# Patient Record
Sex: Male | Born: 1975 | Race: White | Hispanic: No | Smoking: Current some day smoker
Health system: Southern US, Community
[De-identification: ages and names within clinical notes are randomized; demographics above are authoritative.]

---

## 2000-08-26 ENCOUNTER — Emergency Department (HOSPITAL_COMMUNITY): Admission: EM | Admit: 2000-08-26 | Discharge: 2000-08-26 | Payer: Self-pay

## 2004-10-23 ENCOUNTER — Emergency Department (HOSPITAL_COMMUNITY): Admission: EM | Admit: 2004-10-23 | Discharge: 2004-10-23 | Payer: Self-pay | Admitting: Emergency Medicine

## 2005-09-18 ENCOUNTER — Emergency Department (HOSPITAL_COMMUNITY): Admission: EM | Admit: 2005-09-18 | Discharge: 2005-09-18 | Payer: Self-pay | Admitting: Family Medicine

## 2005-10-01 IMAGING — CT CT HEAD W/O CM
1 of 2 series · 13 of 30 positions shown, 17 images · IV contrast (agent unspecified)
Comparison: none

CLINICAL DATA: Altered level of consciousness.  
 CT HEAD WITHOUT CONTRAST:
TECHNIQUE: Multidetector helical CT scanning obtained from the skull base to the vertex.

[Series 3: — · axial · 0.43mm/px · z∈[-170,-45]mm · 13 of 31 slices shown, 17 images]
[im 3/31  brain]
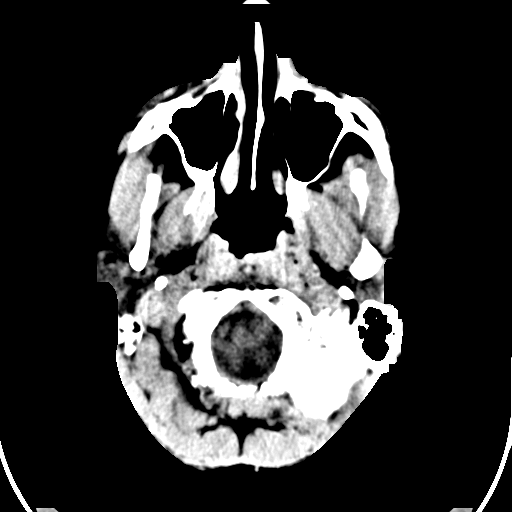
[im 3/31  bone]
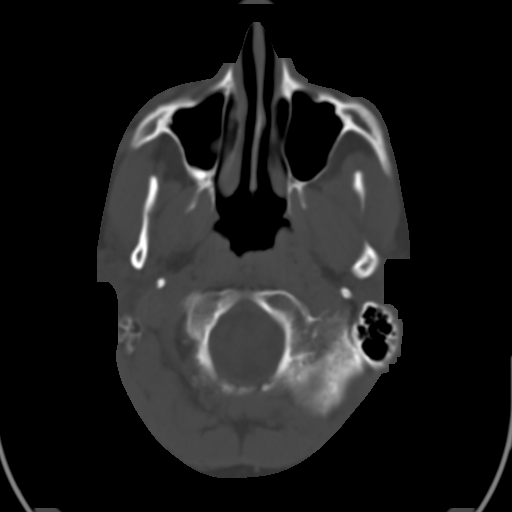
[im 5/31  brain]
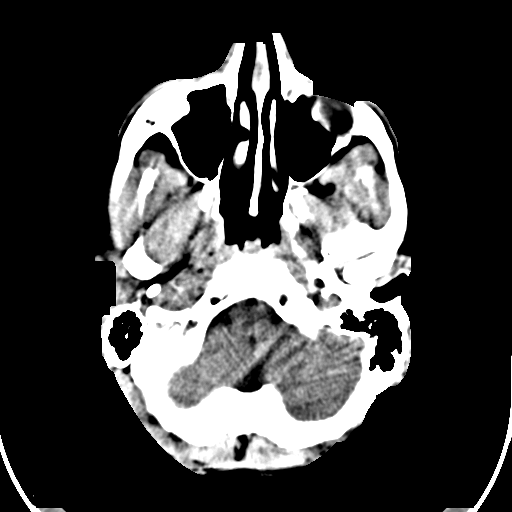
[im 7/31  brain]
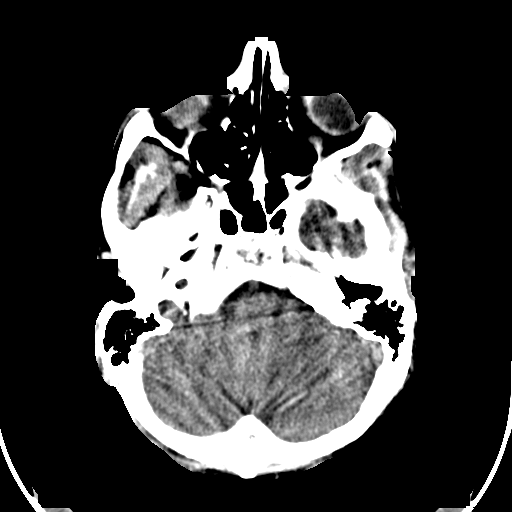
[im 9/31  brain]
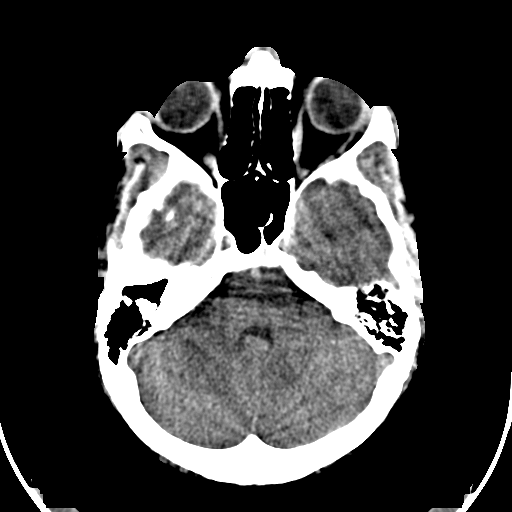
[im 11/31  brain]
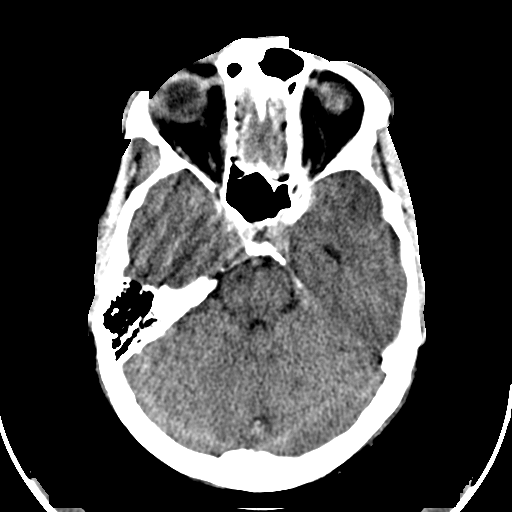
[im 11/31  bone]
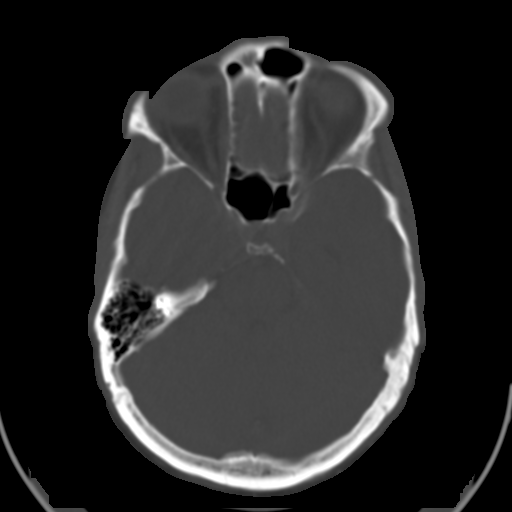
[im 13/31  brain]
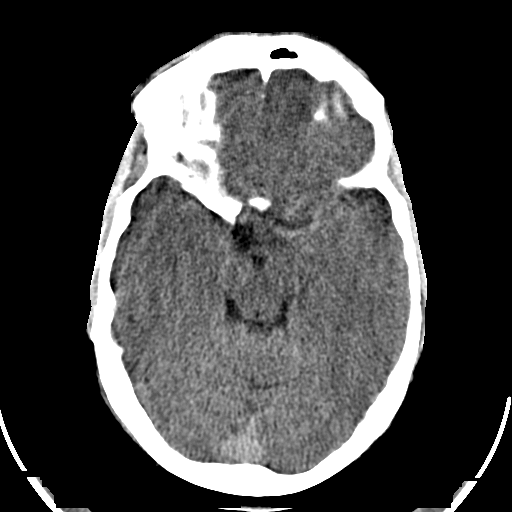
[im 16/31  brain]
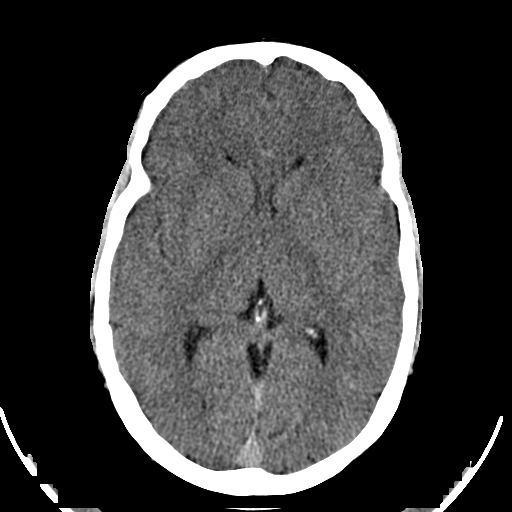
[im 18/31  brain]
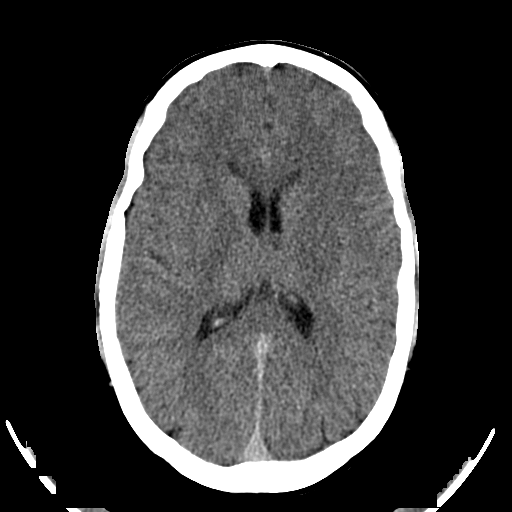
[im 20/31  brain]
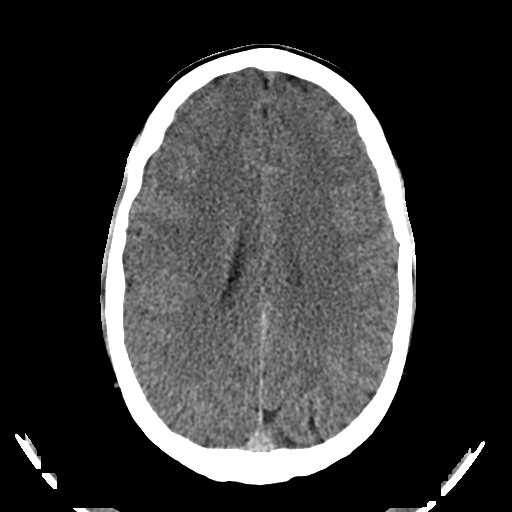
[im 20/31  bone]
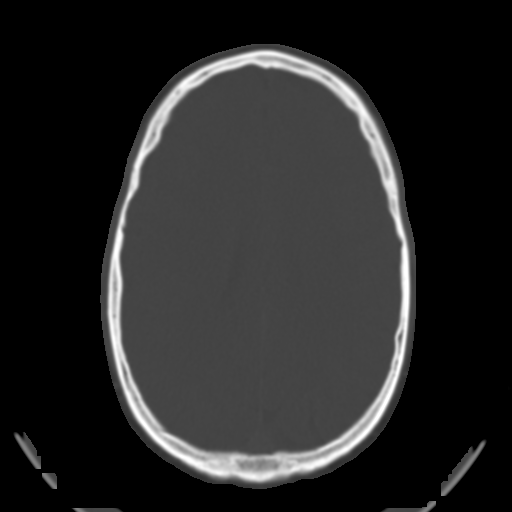
[im 22/31  brain]
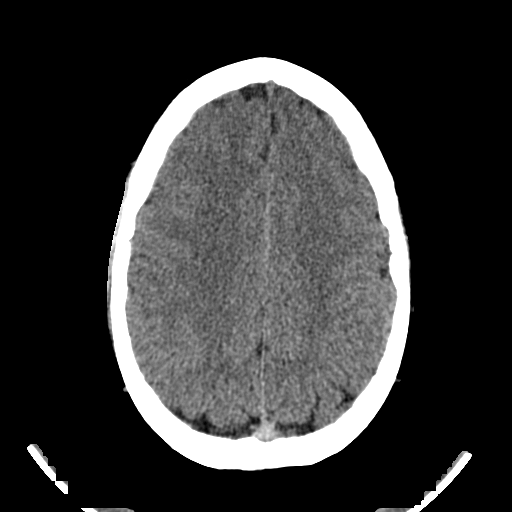
[im 24/31  brain]
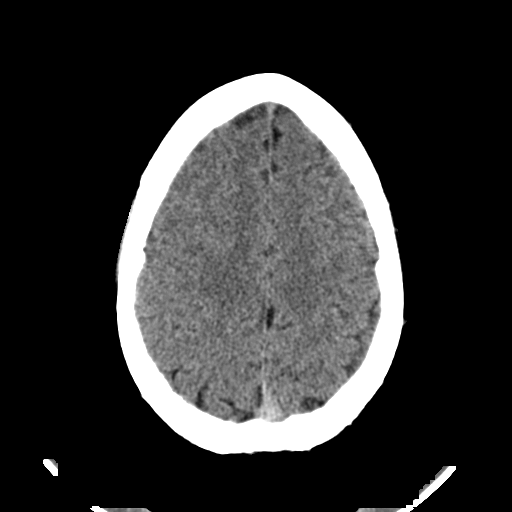
[im 26/31  brain]
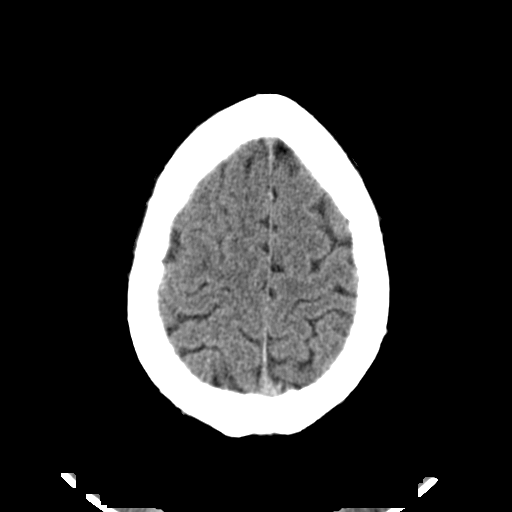
[im 28/31  brain]
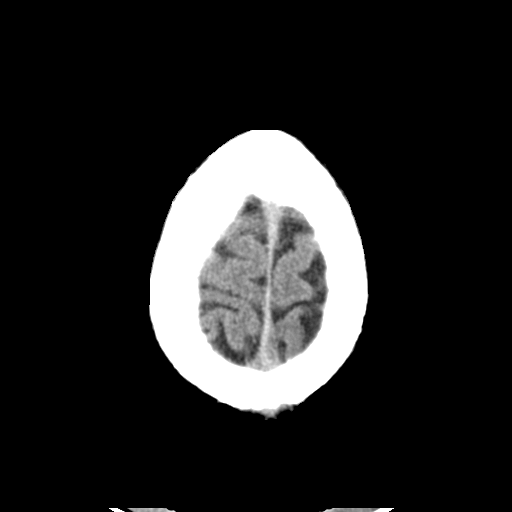
[im 28/31  bone]
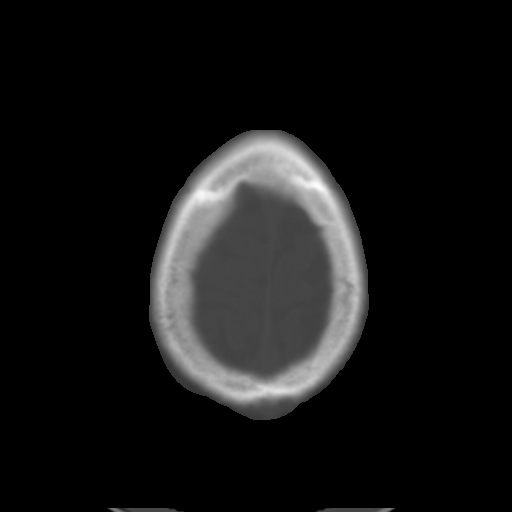

[13 of 30 positions shown; findings below may reference images not displayed]

FINDINGS: No comparison films available. 
 No evidence of acute intracranial abnormality including mass or mass effect, hydrocephalus, extraaxial fluid collection, midline shift, hemorrhage, or infarct.  Acute infarct may be missed by CT for 24 to 48 hours.  The visualized bony calvarium and paranasal sinuses are unremarkable.
IMPRESSION: No evidence of acute intracranial abnormality.

## 2009-01-15 ENCOUNTER — Emergency Department (HOSPITAL_COMMUNITY): Admission: EM | Admit: 2009-01-15 | Discharge: 2009-01-15 | Payer: Self-pay | Admitting: Emergency Medicine

## 2011-07-09 ENCOUNTER — Emergency Department (HOSPITAL_COMMUNITY)
Admission: EM | Admit: 2011-07-09 | Discharge: 2011-07-09 | Disposition: A | Discharge status: Home / Self Care | Payer: Self-pay | Attending: Emergency Medicine | Admitting: Emergency Medicine

## 2011-07-09 DIAGNOSIS — X58XXXA Exposure to other specified factors, initial encounter: Secondary | ICD-10-CM | POA: Insufficient documentation

## 2011-07-09 DIAGNOSIS — S025XXA Fracture of tooth (traumatic), initial encounter for closed fracture: Secondary | ICD-10-CM | POA: Insufficient documentation

## 2011-07-09 DIAGNOSIS — K089 Disorder of teeth and supporting structures, unspecified: Secondary | ICD-10-CM | POA: Insufficient documentation

## 2011-12-29 ENCOUNTER — Encounter (HOSPITAL_COMMUNITY): Payer: Self-pay | Admitting: *Deleted

## 2011-12-29 ENCOUNTER — Emergency Department (HOSPITAL_COMMUNITY)
Admission: EM | Admit: 2011-12-29 | Discharge: 2011-12-29 | Disposition: A | Discharge status: Home / Self Care | Payer: Self-pay | Attending: Emergency Medicine | Admitting: Emergency Medicine

## 2011-12-29 DIAGNOSIS — F17200 Nicotine dependence, unspecified, uncomplicated: Secondary | ICD-10-CM | POA: Insufficient documentation

## 2011-12-29 DIAGNOSIS — F1120 Opioid dependence, uncomplicated: Secondary | ICD-10-CM | POA: Insufficient documentation

## 2011-12-29 DIAGNOSIS — K047 Periapical abscess without sinus: Secondary | ICD-10-CM | POA: Insufficient documentation

## 2011-12-29 MED ORDER — PENICILLIN V POTASSIUM 250 MG PO TABS: 250.0000 mg | ORAL_TABLET | Freq: Four times a day (QID) | ORAL | Status: AC

## 2011-12-29 MED ORDER — IBUPROFEN 800 MG PO TABS: 800.0000 mg | ORAL_TABLET | Freq: Three times a day (TID) | ORAL | Status: AC

## 2011-12-29 NOTE — ED Notes (Signed)
Pt reports R lower toothache x 1 week.  Pt reports that he was seen here for same in Nov 2012, states "i just dont have the funds."

## 2011-12-29 NOTE — Discharge Instructions (Signed)
Please see the attached resources for dental follow up. Take all of the antibiotics as prescribed. Return to the ER with worsening swelling, high fever, or other worrisome symptoms.  RESOURCE GUIDE  Dental Problems  Patients with Medicaid: [facility] [phone]                                           [phone] [address] [employer] Phone:  [phone]                                                  Phone:  [phone]  If unable to pay or uninsured, contact:  [facility] or [facility]. to become qualified for the adult dental clinic.  Chronic Pain Problems Contact [facility]  [phone] Patients need to be referred by their primary care doctor.  Insufficient Money for Medicine Contact [employer] Way:  call "211" or [facility] [phone]) 848-4003.  No Primary Care Doctor Call [facility]  [phone] Other agencies that provide inexpensive medical care    [facility]  [phone]    [facility]  [phone]    [facility]  [phone]    [facility]  [phone]    [facility]  2548758815    [facility]  [phone]  Psychological Services [facility]  [phone] [facility] Services  [phone] [facility]   [phone] (emergency services [phone])  Substance Abuse Resources [facility]  [phone] [facility] [phone] [facility] [place] [phone] Floydene Flock [phone] Residential & Outpatient Substance Abuse Program  [phone]  Abuse/Neglect [facility] Child Abuse Hotline [phone] [facility] Child Abuse Hotline 661-207-5600 (After Hours)  Emergency Shelter [facility] (905)732-9234  Maternity Homes Room at the [place] [facility] [phone] [facility] [phone]  MRSA Hotline #:   219-002-2276    [facility]  Resources  [facility]     [employer]                          [facility] Dept. [address] [place]                       [address]      [address]  [place]                                                Cristobal Goldmann Phone:  [phone]  Phone:  [phone]                 Phone:  [phone]  [facility] Mental [facility] Phone:  [phone]  [facility] Child Abuse Hotline 813-432-9055 630-189-2645 (After Hours)  Dental Abscess A dental abscess usually starts from an infected tooth. Antibiotic medicine and pain pills can be helpful, but dental infections require the attention of a dentist. Rinse around the infected area often with salt water (a pinch of salt in 8 oz of warm water). Do not apply heat to the outside of your face. See your dentist or oral surgeon as soon as possible.  SEEK IMMEDIATE MEDICAL CARE IF:  You have increasing, severe pain that is not relieved by medicine.   You or your child has an oral temperature above 102 F (38.9 C), not controlled by medicine.   Your baby is older than 3 months with a rectal temperature of 102 F (38.9 C) or higher.   Your baby is 78 months old or younger with a rectal temperature of 100.4 F (38 C) or higher.   You develop chills, severe headache, difficulty breathing, or trouble swallowing.   You have swelling in the neck or around the eye.  Document Released: 10/10/2005 Document Revised: 09/29/2011 Document Reviewed: 03/21/2007 [facility] Patient Information 2012 [place], [place].

## 2011-12-29 NOTE — Progress Notes (Signed)
Pt listed as self pay with no insurance coverage Pt confirms he is self pay [place] resident.  CM and [facility] coordinator spoke with him Pt accepted offered [facility] services to assist with finding a [place] self pay provider Provided IRS and local dental program  contact information also

## 2011-12-29 NOTE — ED Provider Notes (Signed)
History     CSN: [id number]  Arrival date & time 12/29/11  1449   First MD Initiated Contact with Patient 12/29/11 1715      Chief Complaint  Patient presents with  . Dental Pain    (Consider location/radiation/quality/duration/timing/severity/associated sxs/prior treatment) Patient is a 36 y.o. male presenting with tooth pain. The history is provided by the patient.  Dental PainThe primary symptoms include mouth pain. Primary symptoms do not include dental injury, oral bleeding, oral lesions, fever, shortness of breath, sore throat, angioedema or cough. The symptoms began 3 to 5 days ago. The symptoms are worsening. The symptoms are recurrent.  Additional symptoms include: dental sensitivity to temperature, gum swelling and gum tenderness. Additional symptoms do not include: trismus, jaw pain, facial swelling, trouble swallowing, pain with swallowing and drooling.   Patient presents with right lower tooth pain for the past week. This is been a recurrent problem. He was seen here for same and November 2012. He states he was referred to an oral surgeon to have the teeth pulled, but was unable to afford to go see him. He noticed an area of gum swelling when he pain initially started, which is now resolved. Denies fever, chills.  History reviewed. No pertinent past medical history.  History reviewed. No pertinent past surgical history.  No family history on file.  History  Substance Use Topics  . Smoking status: Current Some Day Smoker    Types: Cigarettes  . Smokeless tobacco: Not on file  . Alcohol Use: Yes     ocassionally      Review of Systems  Constitutional: Negative for fever.  HENT: Negative for sore throat, facial swelling, drooling and trouble swallowing.   Respiratory: Negative for cough and shortness of breath.   Cardiovascular: Negative.   Gastrointestinal: Negative.     Allergies  Review of patient's allergies indicates no known allergies.  Home Medications     Current Outpatient Rx  Name Route Sig Dispense Refill  . METHADONE HCL 5 MG/5ML PO SOLN Oral Take 19 mg by mouth daily.      BP 118/86  Pulse 74  Temp(Src) 98.3 F (36.8 C) (Oral)  Resp 20  SpO2 98%  Physical Exam  Nursing note and vitals reviewed. Constitutional: He appears well-developed and well-nourished. No distress.  HENT:  Head: Normocephalic and atraumatic.  Right Ear: External ear normal.  Left Ear: External ear normal.  Mouth/Throat: Oropharynx is clear and moist. No oropharyngeal exudate.       Poor dentition throughout. Broken first molar on the right lower side which is tender to palpation. There is no obvious dental abscess present. No trismus or tenderness to palpation of the floor of the mouth.  Neck: Normal range of motion.  Cardiovascular: Normal rate.   Pulmonary/Chest: Effort normal.  Neurological: He is alert.  Skin: Skin is warm and dry. He is not diaphoretic.    ED Course  Procedures (including critical care time)  Labs Reviewed - No data to display No results found.   1. Dental abscess       MDM  Patient with a history of dental caries presents with right lower tooth pain. No obvious evidence for dental abscess. Given patient's tenderness, will start on antibiotics. Emphasized the importance of following up with dentistry to have the tooth addressed. He was given a list of low cost dental resources in the area. Return precautions discussed.        [place], [place] 12/31/11 [phone]

## 2011-12-29 NOTE — ED Notes (Signed)
Pt. States he goes to the methadone clinic and was going to see a doctor there to get some antibiotics but was unable.

## 2011-12-31 NOTE — ED Provider Notes (Signed)
Medical screening examination/treatment/procedure(s) were performed by non-physician practitioner and as supervising physician I was immediately available for consultation/collaboration.   Hurman Horn, MD 12/31/11 2003

## 2020-08-13 ENCOUNTER — Ambulatory Visit: Payer: Self-pay | Attending: Internal Medicine

## 2020-08-13 DIAGNOSIS — Z23 Encounter for immunization: Secondary | ICD-10-CM

## 2020-08-13 NOTE — Progress Notes (Signed)
   Covid-19 Vaccination Clinic  Name:  Kyle Drake    MRN: [id number] DOB: 15-Jul-1976  08/13/2020  [name] was observed post Covid-19 immunization for 15 minutes without incident. He was provided with Vaccine Information Sheet and instruction to access the V-Safe system.   [name] was instructed to call 911 with any severe reactions post vaccine: Marland Kitchen Difficulty breathing  . Swelling of face and throat  . A fast heartbeat  . A bad rash all over body  . Dizziness and weakness   Immunizations Administered    Name Date Dose VIS Date Route   JANSSEN COVID-19 VACCINE 08/13/2020 10:23 AM 0.5 mL 12/21/2019 Intramuscular   Manufacturer: [employer]   Lot: [id number]   NDC: 20355-974-16

## 2020-10-02 ENCOUNTER — Encounter (HOSPITAL_COMMUNITY): Payer: Self-pay

## 2020-10-02 ENCOUNTER — Ambulatory Visit (HOSPITAL_COMMUNITY)
Admission: EM | Admit: 2020-10-02 | Discharge: 2020-10-02 | Disposition: A | Discharge status: Home / Self Care | Payer: Self-pay | Attending: Internal Medicine | Admitting: Internal Medicine

## 2020-10-02 ENCOUNTER — Other Ambulatory Visit: Payer: Self-pay

## 2020-10-02 DIAGNOSIS — S338XXA Sprain of other parts of lumbar spine and pelvis, initial encounter: Secondary | ICD-10-CM

## 2020-10-02 DIAGNOSIS — K5901 Slow transit constipation: Secondary | ICD-10-CM

## 2020-10-02 LAB — POCT URINALYSIS DIPSTICK, ED / UC
Bilirubin Urine: NEGATIVE
Glucose, UA: NEGATIVE mg/dL
Hgb urine dipstick: NEGATIVE
Leukocytes,Ua: NEGATIVE
Nitrite: NEGATIVE
Protein, ur: NEGATIVE mg/dL
Specific Gravity, Urine: >=1.03 (ref 1.005–1.030)
Urobilinogen, UA: 1 mg/dL (ref 0.0–1.0)
pH: 6.5 (ref 5.0–8.0)

## 2020-10-02 MED ORDER — SENNOSIDES-DOCUSATE SODIUM 8.6-50 MG PO TABS: 1.0000 | ORAL_TABLET | Freq: Two times a day (BID) | ORAL | 0 refills | Status: AC

## 2020-10-02 MED ORDER — IBUPROFEN 600 MG PO TABS: 600.0000 mg | ORAL_TABLET | Freq: Four times a day (QID) | ORAL | 0 refills | Status: AC | PRN

## 2020-10-02 MED ORDER — POLYETHYLENE GLYCOL 3350 17 G PO PACK: 17.0000 g | PACK | Freq: Every day | ORAL | 0 refills | Status: AC

## 2020-10-02 NOTE — ED Triage Notes (Signed)
Pt reports pain in groin area X 1 week. Pt states last week had urinary problems. Pt states he feels a gas pain. Pt states last week he woke up to urinate and then had trouble. Pt states he is on opioids and sufferers from constipation.

## 2020-10-06 NOTE — ED Provider Notes (Signed)
[facility]    CSN: [id number] Arrival date & time: 10/02/20  1141      History   Chief Complaint Chief Complaint  Patient presents with  . Groin Pain  . Urinary Retention    HPI Kyle Drake is a 44 y.o. male comes to the urgent care with complaint of groin pain for 1 week duration.  Patient says pain is mainly in the right groin area.  Pain is sharp and radiates to his testicles.  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  He usually has to strain a lot to be able to have a bowel movement.  Stools are hard.  He has not tried any stool softeners.  No nausea or vomiting.  No swelling in the right groin area.  No trauma.   HPI  History reviewed. No pertinent past medical history.  There are no problems to display for this patient.   History reviewed. No pertinent surgical history.     Home Medications    Prior to Admission medications   Medication Sig Start Date End Date Taking? Authorizing Provider  ibuprofen (ADVIL) 600 MG tablet Take 1 tablet (600 mg total) by mouth every 6 (six) hours as needed. 10/02/20   Merrilee Jansky, MD  methadone (DOLOPHINE) 5 MG/5ML solution Take 19 mg by mouth daily.    [provider]  polyethylene glycol (MIRALAX) 17 g packet Take 17 g by mouth daily. 10/02/20   Georgann Bramble, Britta Mccreedy, MD  senna-docusate (SENOKOT-S) 8.6-50 MG tablet Take 1 tablet by mouth 2 (two) times daily. 10/02/20 11/01/20  Merrilee Jansky, MD    Family History History reviewed. No pertinent family history.  Social History Social History   Tobacco Use  . Smoking status: Current Some Day Smoker    Types: Cigarettes  Substance Use Topics  . Alcohol use: Yes    Comment: ocassionally  . Drug use: Yes    Types: Marijuana    Comment: occasionally     Allergies   Patient has no known allergies.   Review of Systems Review of Systems  Respiratory: Negative.   Gastrointestinal: Positive for constipation.  Genitourinary:  Negative for dysuria, flank pain, genital sores, penile swelling, scrotal swelling and urgency.  Musculoskeletal: Negative.      Physical Exam Triage Vital Signs ED Triage Vitals  Enc Vitals Group     BP 10/02/20 1329 115/80     Pulse Rate 10/02/20 1329 69     Resp 10/02/20 1329 15     Temp 10/02/20 1329 98.7 F (37.1 C)     Temp Source 10/02/20 1329 Oral     SpO2 10/02/20 1329 96 %     Weight --      Height --      Head Circumference --      Peak Flow --      Pain Score 10/02/20 1327 7     Pain Loc --      Pain Edu? --      Excl. in GC? --    No data found.  Updated Vital Signs BP 115/80 (BP Location: Right Arm)   Pulse 69   Temp 98.7 F (37.1 C) (Oral)   Resp 15   SpO2 96%   Visual Acuity Right Eye Distance:   Left Eye Distance:   Bilateral Distance:    Right Eye Near:   Left Eye Near:    Bilateral Near:     Physical Exam Vitals and nursing  note reviewed.  Constitutional:      General: He is not in acute distress.    Appearance: He is not ill-appearing.  Cardiovascular:     Rate and Rhythm: Normal rate and regular rhythm.     Pulses: Normal pulses.     Heart sounds: Normal heart sounds.  Pulmonary:     Effort: Pulmonary effort is normal.     Breath sounds: Normal breath sounds.  Abdominal:     General: Bowel sounds are normal.     Palpations: Abdomen is soft.     Tenderness: There is no abdominal tenderness. There is no guarding or rebound.  Musculoskeletal:     Comments: Inguinal orifices are negative for hernia.  No palpable masses in the groin area on coughing.  Neurological:     Mental Status: He is alert.      UC Treatments / Results  Labs (all labs ordered are listed, but only abnormal results are displayed) Labs Reviewed  POCT URINALYSIS DIPSTICK, ED / UC - Abnormal; Notable for the following components:      Result Value   Ketones, ur TRACE (*)    All other components within normal limits    EKG   Radiology No results  found.  Procedures Procedures (including critical care time)  Medications Ordered in UC Medications - No data to display  Initial Impression / Assessment and Plan / UC Course  I have reviewed the triage vital signs and the nursing notes.  Pertinent labs & imaging results that were available during my care of the patient were reviewed by me and considered in my medical decision making (see chart for details).     1.  Right groin sprain likely related to constipation: Senokot 1 tablet twice daily MiraLAX 17 g orally daily as needed for constipation Ibuprofen 600 mg every 6 hours as needed for pain If patient develops persistent right groin swelling with pain, persistent vomiting or abdominal pain/distention-patient is advised to go to the emergency room to be evaluated further. Final Clinical Impressions(s) / UC Diagnoses   Final diagnoses:  Sprain of groin, initial encounter  Slow transit constipation   Discharge Instructions   None    ED Prescriptions    Medication Sig Dispense Auth. Provider   senna-docusate (SENOKOT-S) 8.6-50 MG tablet Take 1 tablet by mouth 2 (two) times daily. 60 tablet Ellyson Rarick, Britta Mccreedy, MD   ibuprofen (ADVIL) 600 MG tablet Take 1 tablet (600 mg total) by mouth every 6 (six) hours as needed. 30 tablet Ilia Dimaano, Britta Mccreedy, MD   polyethylene glycol (MIRALAX) 17 g packet Take 17 g by mouth daily. 14 each Simaya Lumadue, Britta Mccreedy, MD     PDMP not reviewed this encounter.   Merrilee Jansky, MD 10/06/20 [phone]

## 2021-06-26 ENCOUNTER — Encounter (HOSPITAL_BASED_OUTPATIENT_CLINIC_OR_DEPARTMENT_OTHER): Payer: Self-pay | Admitting: Emergency Medicine

## 2021-06-26 ENCOUNTER — Other Ambulatory Visit: Payer: Self-pay

## 2021-06-26 ENCOUNTER — Emergency Department (HOSPITAL_BASED_OUTPATIENT_CLINIC_OR_DEPARTMENT_OTHER)
Admission: EM | Admit: 2021-06-26 | Discharge: 2021-06-26 | Disposition: A | Discharge status: Home / Self Care | Payer: Self-pay | Attending: Emergency Medicine | Admitting: Emergency Medicine

## 2021-06-26 DIAGNOSIS — J029 Acute pharyngitis, unspecified: Secondary | ICD-10-CM

## 2021-06-26 DIAGNOSIS — F17210 Nicotine dependence, cigarettes, uncomplicated: Secondary | ICD-10-CM | POA: Insufficient documentation

## 2021-06-26 DIAGNOSIS — R0981 Nasal congestion: Secondary | ICD-10-CM | POA: Insufficient documentation

## 2021-06-26 DIAGNOSIS — U071 COVID-19: Secondary | ICD-10-CM | POA: Insufficient documentation

## 2021-06-26 LAB — GROUP A STREP BY PCR: Group A Strep by PCR: NOT DETECTED

## 2021-06-26 LAB — RESP PANEL BY RT-PCR (FLU A&B, COVID) ARPGX2
Influenza A by PCR: NEGATIVE
Influenza B by PCR: NEGATIVE
SARS Coronavirus 2 by RT PCR: POSITIVE — AB

## 2021-06-26 MED ORDER — CEPACOL SORE THROAT SPRAY 0.1-33 % MT LIQD: OROMUCOSAL | 0 refills | Status: AC

## 2021-06-26 MED ORDER — BENZONATATE 100 MG PO CAPS: 100.0000 mg | ORAL_CAPSULE | Freq: Three times a day (TID) | ORAL | 0 refills | Status: AC | PRN

## 2021-06-26 NOTE — ED Provider Notes (Signed)
[facility] EMERGENCY DEPT Provider Note   CSN: [id number] Arrival date & time: 06/26/21  1455    History Chief Complaint  Patient presents with   Sore Throat    Kyle Drake is a 45 y.o. male presenting with sore throat. Symptoms started 2 days ago. Taking Ibuprofen which helped initially but didn't resolve his throat pain today. Tolerating PO without issue. He reports very mild associated nasal congestion. No significant cough. Subjective fever Thursday night but no fever since then. No shortness of breath, chest pain, rash, vomiting, diarrhea, or other complaints. +sick contact at his methadone clinic. Vaccinated for COVID x1 (J&J).    History reviewed. No pertinent past medical history.  There are no problems to display for this patient.   History reviewed. No pertinent surgical history.     No family history on file.  Social History   Tobacco Use   Smoking status: Some Days    Types: Cigarettes  Substance Use Topics   Alcohol use: Yes    Comment: ocassionally   Drug use: Yes    Types: Marijuana    Comment: occasionally    Home Medications Prior to Admission medications   Medication Sig Start Date End Date Taking? Authorizing Provider  benzonatate (TESSALON PERLES) 100 MG capsule Take 1 capsule (100 mg total) by mouth 3 (three) times daily as needed for cough. 06/26/21 06/26/22 Yes Maury Dus, MD  Dyclonine-Glycerin (CEPACOL SORE THROAT SPRAY) 0.1-33 % LIQD Use up to 4 times daily as needed for sore throat 06/26/21  Yes Maury Dus, MD  ibuprofen (ADVIL) 600 MG tablet Take 1 tablet (600 mg total) by mouth every 6 (six) hours as needed. 10/02/20   Merrilee Jansky, MD  methadone (DOLOPHINE) 5 MG/5ML solution Take 19 mg by mouth daily.    [provider]  polyethylene glycol (MIRALAX) 17 g packet Take 17 g by mouth daily. 10/02/20   LampteyBritta Mccreedy, MD    Allergies    Patient has no known allergies.  Review of Systems    Review of Systems  Constitutional:  Negative for fever.  HENT:  Positive for congestion and sore throat. Negative for trouble swallowing.   Respiratory:  Negative for cough and shortness of breath.   Cardiovascular:  Negative for chest pain.  Gastrointestinal:  Negative for abdominal pain, diarrhea, nausea and vomiting.  Musculoskeletal:  Negative for myalgias.  Skin:  Negative for rash.   Physical Exam Updated Vital Signs BP 117/81   Pulse 66   Temp 98.5 F (36.9 C) (Oral)   Resp 16   Ht 5\' 11"  (1.803 m)   Wt 81.6 kg   SpO2 100%   BMI 25.10 kg/m   Physical Exam Constitutional:      General: He is not in acute distress.    Appearance: He is not toxic-appearing.  HENT:     Head: Normocephalic and atraumatic.     Nose: No rhinorrhea.     Mouth/Throat:     Mouth: Mucous membranes are moist.     Pharynx: Oropharynx is clear. Uvula midline. No pharyngeal swelling, oropharyngeal exudate or posterior oropharyngeal erythema.  Eyes:     Conjunctiva/sclera: Conjunctivae normal.  Cardiovascular:     Rate and Rhythm: Normal rate and regular rhythm.     Heart sounds: Normal heart sounds.  Pulmonary:     Effort: Pulmonary effort is normal.     Breath sounds: Normal breath sounds.  Lymphadenopathy:     Cervical: No cervical adenopathy.  Skin:    Findings: No rash.  Neurological:     General: No focal deficit present.     Mental Status: He is alert.    ED Results / Procedures / Treatments   Labs (all labs ordered are listed, but only abnormal results are displayed) Labs Reviewed  RESP PANEL BY RT-PCR (FLU A&B, COVID) ARPGX2 - Abnormal; Notable for the following components:      Result Value   SARS Coronavirus 2 by RT PCR POSITIVE (*)    All other components within normal limits  GROUP A STREP BY PCR    EKG None  Radiology No results found.  Procedures Procedures   Medications Ordered in ED Medications - No data to display  ED Course  I have reviewed the  triage vital signs and the nursing notes.  Pertinent labs & imaging results that were available during my care of the patient were reviewed by me and considered in my medical decision making (see chart for details).    MDM Rules/Calculators/A&P                         45 year old male with no PMHx other than prior opioid use, now stable on Methadone presents with sore throat x2 days.  Mild associated nasal congestion but otherwise no fever, cough, SOB, or other symptoms. +sick contacts. Exam is largely unremarkable with no oropharyngeal erythema, edema, or exudate.  Ddx includes viral URI, viral pharyngitis, COVID, strep. No concern for peritonsillar or retropharyngeal abscess. Will obtain rapid strep and COVID swab.  Patient found to be COVID positive. Rapid strep was negative.  Fortunately his symptoms are mild. No medical problems or risk factors for severe disease, therefore will treat w/supportive care only. Stable for discharge home at this time. Recommended Ibuprofen/Tylenol as needed and Rx sent for Cepacol throat spray and Tessalon Perles. Return precautions reviewed.  Final Clinical Impression(s) / ED Diagnoses Final diagnoses:  COVID-19  Sore throat    Rx / DC Orders ED Discharge Orders          Ordered    benzonatate (TESSALON PERLES) 100 MG capsule  3 times daily PRN        06/26/21 1658    Dyclonine-Glycerin (CEPACOL SORE THROAT SPRAY) 0.1-33 % LIQD        06/26/21 1658            Maury Dus, MD PGY-2 [facility]   Maury Dus, MD 06/26/21 Leeroy Cha    Arby Barrette, MD 06/27/21 1428

## 2021-06-26 NOTE — ED Triage Notes (Signed)
Pt c/o subjective fever, sore throat since Thursday. Also c/o head congestion and green sputum.

## 2021-06-26 NOTE — Discharge Instructions (Addendum)
You were seen in the emergency department for sore throat.  Your testing was positive for COVID-19.  You should quarantine for 5 days. You can end your quarantine after 5 days only if you remain fever free and your symptoms are improving. Continue to take Tylenol and/or ibuprofen as needed. We have also sent a throat spray and a medication that can help with cough to your pharmacy.  Reasons to return to the ED: -difficulty breathing -inability to eat/drink for more than 24 hours -persistent vomiting

## 2023-05-29 DIAGNOSIS — F1120 Opioid dependence, uncomplicated: Secondary | ICD-10-CM | POA: Diagnosis not present

## 2023-06-29 DIAGNOSIS — F1120 Opioid dependence, uncomplicated: Secondary | ICD-10-CM | POA: Diagnosis not present

## 2023-08-03 DIAGNOSIS — F1120 Opioid dependence, uncomplicated: Secondary | ICD-10-CM | POA: Diagnosis not present
